# Patient Record
Sex: Male | Born: 1940 | Race: White | Hispanic: No | Marital: Single
Health system: Southern US, Community
[De-identification: ages and names within clinical notes are randomized; demographics above are authoritative.]

---

## 2014-03-23 ENCOUNTER — Emergency Department: Payer: Self-pay | Admitting: Emergency Medicine

## 2014-03-23 LAB — CBC
HCT: 44.2 % (ref 40.0–52.0)
HGB: 14.4 g/dL (ref 13.0–18.0)
MCH: 28.5 pg (ref 26.0–34.0)
MCHC: 32.5 g/dL (ref 32.0–36.0)
MCV: 88 fL (ref 80–100)
PLATELETS: 447 10*3/uL — AB (ref 150–440)
RBC: 5.05 10*6/uL (ref 4.40–5.90)
RDW: 13.6 % (ref 11.5–14.5)
WBC: 15 10*3/uL — AB (ref 3.8–10.6)

## 2014-03-23 LAB — COMPREHENSIVE METABOLIC PANEL
ALBUMIN: 2.7 g/dL — AB (ref 3.4–5.0)
ALT: 57 U/L (ref 12–78)
Alkaline Phosphatase: 188 U/L — ABNORMAL HIGH
Anion Gap: 10 (ref 7–16)
BILIRUBIN TOTAL: 0.9 mg/dL (ref 0.2–1.0)
BUN: 10 mg/dL (ref 7–18)
CREATININE: 0.72 mg/dL (ref 0.60–1.30)
Calcium, Total: 9.1 mg/dL (ref 8.5–10.1)
Chloride: 94 mmol/L — ABNORMAL LOW (ref 98–107)
Co2: 25 mmol/L (ref 21–32)
EGFR (African American): >60
EGFR (Non-African Amer.): >60
GLUCOSE: 106 mg/dL — AB (ref 65–99)
Osmolality: 258 (ref 275–301)
Potassium: 4.4 mmol/L (ref 3.5–5.1)
SGOT(AST): 70 U/L — ABNORMAL HIGH (ref 15–37)
SODIUM: 129 mmol/L — AB (ref 136–145)
Total Protein: 7.7 g/dL (ref 6.4–8.2)

## 2014-03-23 LAB — CK TOTAL AND CKMB (NOT AT ARMC)
CK, Total: 33 U/L — ABNORMAL LOW
CK-MB: <0.5 ng/mL — ABNORMAL LOW (ref 0.5–3.6)

## 2014-03-23 LAB — TROPONIN I

## 2014-03-26 LAB — PATHOLOGY REPORT

## 2014-03-31 ENCOUNTER — Ambulatory Visit: Payer: Self-pay | Admitting: Internal Medicine

## 2014-03-31 ENCOUNTER — Ambulatory Visit: Payer: Self-pay

## 2014-03-31 LAB — CBC CANCER CENTER
Basophil #: 0.1 x10 3/mm (ref 0.0–0.1)
Basophil %: 0.8 %
EOS PCT: 0.4 %
Eosinophil #: 0.1 x10 3/mm (ref 0.0–0.7)
HCT: 43.9 % (ref 40.0–52.0)
HGB: 14.2 g/dL (ref 13.0–18.0)
LYMPHS ABS: 2.2 x10 3/mm (ref 1.0–3.6)
Lymphocyte %: 12 %
MCH: 28.2 pg (ref 26.0–34.0)
MCHC: 32.5 g/dL (ref 32.0–36.0)
MCV: 87 fL (ref 80–100)
MONOS PCT: 10.5 %
Monocyte #: 2 x10 3/mm — ABNORMAL HIGH (ref 0.2–1.0)
NEUTROS ABS: 14.4 x10 3/mm — AB (ref 1.4–6.5)
NEUTROS PCT: 76.3 %
Platelet: 520 x10 3/mm — ABNORMAL HIGH (ref 150–440)
RBC: 5.04 10*6/uL (ref 4.40–5.90)
RDW: 14.2 % (ref 11.5–14.5)
WBC: 18.8 x10 3/mm — ABNORMAL HIGH (ref 3.8–10.6)

## 2014-03-31 LAB — COMPREHENSIVE METABOLIC PANEL
ALK PHOS: 175 U/L — AB
Albumin: 2.9 g/dL — ABNORMAL LOW (ref 3.4–5.0)
Anion Gap: 9 (ref 7–16)
BUN: 7 mg/dL (ref 7–18)
Bilirubin,Total: 0.8 mg/dL (ref 0.2–1.0)
CALCIUM: 9.4 mg/dL (ref 8.5–10.1)
CO2: 31 mmol/L (ref 21–32)
Chloride: 94 mmol/L — ABNORMAL LOW (ref 98–107)
Creatinine: 0.74 mg/dL (ref 0.60–1.30)
EGFR (African American): >60
Glucose: 94 mg/dL (ref 65–99)
OSMOLALITY: 266 (ref 275–301)
POTASSIUM: 4.1 mmol/L (ref 3.5–5.1)
SGOT(AST): 53 U/L — ABNORMAL HIGH (ref 15–37)
SGPT (ALT): 38 U/L (ref 12–78)
SODIUM: 134 mmol/L — AB (ref 136–145)
Total Protein: 7.6 g/dL (ref 6.4–8.2)

## 2014-03-31 LAB — APTT: Activated PTT: 32.1 secs (ref 23.6–35.9)

## 2014-03-31 LAB — PROTIME-INR
INR: 1.1
Prothrombin Time: 14.5 secs (ref 11.5–14.7)

## 2014-04-01 LAB — CANCER ANTIGEN 19-9: CA 19-9: 10956 U/mL — ABNORMAL HIGH (ref 0–35)

## 2014-04-07 ENCOUNTER — Ambulatory Visit: Payer: Self-pay

## 2014-04-07 ENCOUNTER — Ambulatory Visit: Payer: Self-pay | Admitting: Internal Medicine

## 2014-04-07 DIAGNOSIS — C259 Malignant neoplasm of pancreas, unspecified: Secondary | ICD-10-CM

## 2014-04-07 LAB — BASIC METABOLIC PANEL
Anion Gap: 6 — ABNORMAL LOW (ref 7–16)
BUN: 8 mg/dL (ref 7–18)
CHLORIDE: 92 mmol/L — AB (ref 98–107)
Calcium, Total: 8.9 mg/dL (ref 8.5–10.1)
Co2: 33 mmol/L — ABNORMAL HIGH (ref 21–32)
Creatinine: 0.64 mg/dL (ref 0.60–1.30)
EGFR (African American): >60
GLUCOSE: 129 mg/dL — AB (ref 65–99)
OSMOLALITY: 263 (ref 275–301)
Potassium: 4.3 mmol/L (ref 3.5–5.1)
Sodium: 131 mmol/L — ABNORMAL LOW (ref 136–145)

## 2014-04-08 LAB — PATHOLOGY REPORT

## 2014-04-10 ENCOUNTER — Ambulatory Visit: Payer: Self-pay

## 2014-04-14 LAB — CBC CANCER CENTER
HCT: 41.4 % (ref 40.0–52.0)
HGB: 12.9 g/dL — ABNORMAL LOW (ref 13.0–18.0)
LYMPHS PCT: 7 %
MCH: 27.7 pg (ref 26.0–34.0)
MCHC: 31.2 g/dL — AB (ref 32.0–36.0)
MCV: 89 fL (ref 80–100)
Metamyelocyte: 1 %
Monocytes: 14 %
PLATELETS: 533 x10 3/mm — AB (ref 150–440)
RBC: 4.66 10*6/uL (ref 4.40–5.90)
RDW: 15.5 % — ABNORMAL HIGH (ref 11.5–14.5)
Segmented Neutrophils: 78 %
WBC: 19.8 x10 3/mm — AB (ref 3.8–10.6)

## 2014-04-14 LAB — HEPATIC FUNCTION PANEL A (ARMC)
ALBUMIN: 2.3 g/dL — AB (ref 3.4–5.0)
ALK PHOS: 246 U/L — AB
Bilirubin, Direct: 0.2 mg/dL (ref 0.00–0.20)
Bilirubin,Total: 0.9 mg/dL (ref 0.2–1.0)
SGOT(AST): 53 U/L — ABNORMAL HIGH (ref 15–37)
SGPT (ALT): 18 U/L
TOTAL PROTEIN: 6.9 g/dL (ref 6.4–8.2)

## 2014-04-14 LAB — CREATININE, SERUM: Creatinine: 0.48 mg/dL — ABNORMAL LOW (ref 0.60–1.30)

## 2014-04-18 ENCOUNTER — Ambulatory Visit: Payer: Self-pay | Admitting: Internal Medicine

## 2014-04-23 LAB — BASIC METABOLIC PANEL
ANION GAP: 10 (ref 7–16)
BUN: 18 mg/dL (ref 7–18)
CREATININE: 0.92 mg/dL (ref 0.60–1.30)
Calcium, Total: 9.2 mg/dL (ref 8.5–10.1)
Chloride: 96 mmol/L — ABNORMAL LOW (ref 98–107)
Co2: 25 mmol/L (ref 21–32)
EGFR (African American): >60
EGFR (Non-African Amer.): >60
Glucose: 165 mg/dL — ABNORMAL HIGH (ref 65–99)
Osmolality: 268 (ref 275–301)
POTASSIUM: 4.4 mmol/L (ref 3.5–5.1)
SODIUM: 131 mmol/L — AB (ref 136–145)

## 2014-04-23 LAB — CBC CANCER CENTER
BASOS ABS: 0.1 x10 3/mm (ref 0.0–0.1)
BASOS PCT: 0.5 %
Eosinophil #: 0 x10 3/mm (ref 0.0–0.7)
Eosinophil %: 0.4 %
HCT: 39.7 % — ABNORMAL LOW (ref 40.0–52.0)
HGB: 12.7 g/dL — ABNORMAL LOW (ref 13.0–18.0)
LYMPHS ABS: 1.8 x10 3/mm (ref 1.0–3.6)
LYMPHS PCT: 14.4 %
MCH: 28.6 pg (ref 26.0–34.0)
MCHC: 32.1 g/dL (ref 32.0–36.0)
MCV: 89 fL (ref 80–100)
Monocyte #: 1.3 x10 3/mm — ABNORMAL HIGH (ref 0.2–1.0)
Monocyte %: 10.6 %
Neutrophil #: 9.1 x10 3/mm — ABNORMAL HIGH (ref 1.4–6.5)
Neutrophil %: 74.1 %
Platelet: 307 x10 3/mm (ref 150–440)
RBC: 4.46 10*6/uL (ref 4.40–5.90)
RDW: 15.2 % — AB (ref 11.5–14.5)
WBC: 12.3 x10 3/mm — ABNORMAL HIGH (ref 3.8–10.6)

## 2014-05-07 LAB — CBC CANCER CENTER
BASOS PCT: 0.7 %
Basophil #: 0.1 x10 3/mm (ref 0.0–0.1)
Eosinophil #: 0 x10 3/mm (ref 0.0–0.7)
Eosinophil %: 0.2 %
HCT: 36.4 % — ABNORMAL LOW (ref 40.0–52.0)
HGB: 11.6 g/dL — AB (ref 13.0–18.0)
LYMPHS PCT: 8.5 %
Lymphocyte #: 1.6 x10 3/mm (ref 1.0–3.6)
MCH: 27.8 pg (ref 26.0–34.0)
MCHC: 32 g/dL (ref 32.0–36.0)
MCV: 87 fL (ref 80–100)
MONO ABS: 2.2 x10 3/mm — AB (ref 0.2–1.0)
Monocyte %: 11.8 %
NEUTROS ABS: 14.4 x10 3/mm — AB (ref 1.4–6.5)
NEUTROS PCT: 78.8 %
Platelet: 551 x10 3/mm — ABNORMAL HIGH (ref 150–440)
RBC: 4.18 10*6/uL — ABNORMAL LOW (ref 4.40–5.90)
RDW: 16.3 % — ABNORMAL HIGH (ref 11.5–14.5)
WBC: 18.3 x10 3/mm — ABNORMAL HIGH (ref 3.8–10.6)

## 2014-05-07 LAB — BASIC METABOLIC PANEL
Anion Gap: 8 (ref 7–16)
BUN: 14 mg/dL (ref 7–18)
CALCIUM: 9.1 mg/dL (ref 8.5–10.1)
CHLORIDE: 94 mmol/L — AB (ref 98–107)
CO2: 29 mmol/L (ref 21–32)
CREATININE: 0.7 mg/dL (ref 0.60–1.30)
EGFR (Non-African Amer.): >60
Glucose: 104 mg/dL — ABNORMAL HIGH (ref 65–99)
Osmolality: 263 (ref 275–301)
POTASSIUM: 3.8 mmol/L (ref 3.5–5.1)
Sodium: 131 mmol/L — ABNORMAL LOW (ref 136–145)

## 2014-05-07 LAB — HEPATIC FUNCTION PANEL A (ARMC)
ALK PHOS: 205 U/L — AB
ALT: 52 U/L
AST: 41 U/L — AB (ref 15–37)
Albumin: 2.5 g/dL — ABNORMAL LOW (ref 3.4–5.0)
BILIRUBIN DIRECT: 0.4 mg/dL — AB (ref 0.00–0.20)
BILIRUBIN TOTAL: 1.4 mg/dL — AB (ref 0.2–1.0)
Total Protein: 6.9 g/dL (ref 6.4–8.2)

## 2014-05-07 LAB — PHOSPHORUS: PHOSPHORUS: 3.6 mg/dL (ref 2.5–4.9)

## 2014-05-07 LAB — MAGNESIUM: MAGNESIUM: 1.7 mg/dL — AB

## 2014-05-14 LAB — CREATININE, SERUM: Creatinine: 0.69 mg/dL (ref 0.60–1.30)

## 2014-05-14 LAB — CBC CANCER CENTER
BASOS ABS: 0.1 x10 3/mm (ref 0.0–0.1)
Basophil %: 0.7 %
Eosinophil #: 0.1 x10 3/mm (ref 0.0–0.7)
Eosinophil %: 0.6 %
HCT: 35.7 % — AB (ref 40.0–52.0)
HGB: 11.8 g/dL — ABNORMAL LOW (ref 13.0–18.0)
LYMPHS PCT: 16.7 %
Lymphocyte #: 1.7 x10 3/mm (ref 1.0–3.6)
MCH: 29 pg (ref 26.0–34.0)
MCHC: 33 g/dL (ref 32.0–36.0)
MCV: 88 fL (ref 80–100)
MONOS PCT: 12.3 %
Monocyte #: 1.2 x10 3/mm — ABNORMAL HIGH (ref 0.2–1.0)
NEUTROS PCT: 69.7 %
Neutrophil #: 7 x10 3/mm — ABNORMAL HIGH (ref 1.4–6.5)
Platelet: 375 x10 3/mm (ref 150–440)
RBC: 4.08 10*6/uL — ABNORMAL LOW (ref 4.40–5.90)
RDW: 15.9 % — ABNORMAL HIGH (ref 11.5–14.5)
WBC: 10 x10 3/mm (ref 3.8–10.6)

## 2014-05-19 ENCOUNTER — Ambulatory Visit: Payer: Self-pay | Admitting: Internal Medicine

## 2014-05-21 LAB — BASIC METABOLIC PANEL
Anion Gap: 7 (ref 7–16)
BUN: 10 mg/dL (ref 7–18)
CALCIUM: 8.7 mg/dL (ref 8.5–10.1)
CHLORIDE: 99 mmol/L (ref 98–107)
CO2: 30 mmol/L (ref 21–32)
Creatinine: 0.59 mg/dL — ABNORMAL LOW (ref 0.60–1.30)
EGFR (African American): >60
Glucose: 90 mg/dL (ref 65–99)
Osmolality: 271 (ref 275–301)
POTASSIUM: 3.2 mmol/L — AB (ref 3.5–5.1)
Sodium: 136 mmol/L (ref 136–145)

## 2014-05-21 LAB — CBC CANCER CENTER
BASOS ABS: 0.1 x10 3/mm (ref 0.0–0.1)
BASOS PCT: 0.9 %
EOS ABS: 0 x10 3/mm (ref 0.0–0.7)
EOS PCT: 0.8 %
HCT: 31.3 % — ABNORMAL LOW (ref 40.0–52.0)
HGB: 10.4 g/dL — ABNORMAL LOW (ref 13.0–18.0)
LYMPHS PCT: 29.3 %
Lymphocyte #: 1.8 x10 3/mm (ref 1.0–3.6)
MCH: 29.2 pg (ref 26.0–34.0)
MCHC: 33.4 g/dL (ref 32.0–36.0)
MCV: 87 fL (ref 80–100)
MONO ABS: 0.6 x10 3/mm (ref 0.2–1.0)
MONOS PCT: 9.6 %
Neutrophil #: 3.7 x10 3/mm (ref 1.4–6.5)
Neutrophil %: 59.4 %
Platelet: 242 x10 3/mm (ref 150–440)
RBC: 3.58 10*6/uL — AB (ref 4.40–5.90)
RDW: 16.3 % — ABNORMAL HIGH (ref 11.5–14.5)
WBC: 6.2 x10 3/mm (ref 3.8–10.6)

## 2014-05-21 LAB — HEPATIC FUNCTION PANEL A (ARMC)
ALBUMIN: 2.4 g/dL — AB (ref 3.4–5.0)
ALT: 103 U/L — AB
AST: 64 U/L — AB (ref 15–37)
Alkaline Phosphatase: 261 U/L — ABNORMAL HIGH
BILIRUBIN DIRECT: 0.5 mg/dL — AB (ref 0.00–0.20)
Bilirubin,Total: 1.4 mg/dL — ABNORMAL HIGH (ref 0.2–1.0)
Total Protein: 6.1 g/dL — ABNORMAL LOW (ref 6.4–8.2)

## 2014-05-21 LAB — PHOSPHORUS: Phosphorus: 3.4 mg/dL (ref 2.5–4.9)

## 2014-05-21 LAB — MAGNESIUM: Magnesium: 1.7 mg/dL — ABNORMAL LOW

## 2014-05-28 LAB — COMPREHENSIVE METABOLIC PANEL
Albumin: 2.4 g/dL — ABNORMAL LOW (ref 3.4–5.0)
Alkaline Phosphatase: 267 U/L — ABNORMAL HIGH
Anion Gap: 8 (ref 7–16)
BILIRUBIN TOTAL: 1.2 mg/dL — AB (ref 0.2–1.0)
BUN: 11 mg/dL (ref 7–18)
CALCIUM: 8.5 mg/dL (ref 8.5–10.1)
CHLORIDE: 99 mmol/L (ref 98–107)
Co2: 27 mmol/L (ref 21–32)
Creatinine: 0.54 mg/dL — ABNORMAL LOW (ref 0.60–1.30)
EGFR (African American): >60
EGFR (Non-African Amer.): >60
Glucose: 89 mg/dL (ref 65–99)
Osmolality: 267 (ref 275–301)
Potassium: 3.6 mmol/L (ref 3.5–5.1)
SGOT(AST): 76 U/L — ABNORMAL HIGH (ref 15–37)
SGPT (ALT): 78 U/L — ABNORMAL HIGH
SODIUM: 134 mmol/L — AB (ref 136–145)
TOTAL PROTEIN: 6.2 g/dL — AB (ref 6.4–8.2)

## 2014-05-28 LAB — MAGNESIUM: MAGNESIUM: 1.6 mg/dL — AB

## 2014-05-28 LAB — CBC CANCER CENTER
BASOS ABS: 0.1 x10 3/mm (ref 0.0–0.1)
BASOS PCT: 1.4 %
EOS ABS: 0.2 x10 3/mm (ref 0.0–0.7)
EOS PCT: 1.9 %
HCT: 33.1 % — AB (ref 40.0–52.0)
HGB: 10.6 g/dL — AB (ref 13.0–18.0)
LYMPHS ABS: 2.3 x10 3/mm (ref 1.0–3.6)
LYMPHS PCT: 21.9 %
MCH: 28.6 pg (ref 26.0–34.0)
MCHC: 32 g/dL (ref 32.0–36.0)
MCV: 89 fL (ref 80–100)
Monocyte #: 1.4 x10 3/mm — ABNORMAL HIGH (ref 0.2–1.0)
Monocyte %: 13.4 %
Neutrophil #: 6.5 x10 3/mm (ref 1.4–6.5)
Neutrophil %: 61.4 %
Platelet: 698 x10 3/mm — ABNORMAL HIGH (ref 150–440)
RBC: 3.7 10*6/uL — ABNORMAL LOW (ref 4.40–5.90)
RDW: 19 % — AB (ref 11.5–14.5)
WBC: 10.5 x10 3/mm (ref 3.8–10.6)

## 2014-05-28 LAB — PHOSPHORUS: Phosphorus: 3.4 mg/dL (ref 2.5–4.9)

## 2014-06-04 LAB — CBC CANCER CENTER
BASOS ABS: 0.1 x10 3/mm (ref 0.0–0.1)
BASOS PCT: 0.7 %
Eosinophil #: 0.1 x10 3/mm (ref 0.0–0.7)
Eosinophil %: 0.6 %
HCT: 29.6 % — ABNORMAL LOW (ref 40.0–52.0)
HGB: 9.7 g/dL — ABNORMAL LOW (ref 13.0–18.0)
LYMPHS ABS: 2.7 x10 3/mm (ref 1.0–3.6)
Lymphocyte %: 28 %
MCH: 29.7 pg (ref 26.0–34.0)
MCHC: 32.7 g/dL (ref 32.0–36.0)
MCV: 91 fL (ref 80–100)
Monocyte #: 1.4 x10 3/mm — ABNORMAL HIGH (ref 0.2–1.0)
Monocyte %: 14.7 %
Neutrophil #: 5.4 x10 3/mm (ref 1.4–6.5)
Neutrophil %: 56 %
PLATELETS: 549 x10 3/mm — AB (ref 150–440)
RBC: 3.26 10*6/uL — AB (ref 4.40–5.90)
RDW: 19.3 % — AB (ref 11.5–14.5)
WBC: 9.6 x10 3/mm (ref 3.8–10.6)

## 2014-06-04 LAB — CREATININE, SERUM
Creatinine: 0.66 mg/dL (ref 0.60–1.30)
EGFR (African American): >60
EGFR (Non-African Amer.): >60

## 2014-06-08 ENCOUNTER — Ambulatory Visit: Payer: Self-pay | Admitting: Internal Medicine

## 2014-06-08 LAB — CANCER ANTIGEN 19-9: CA 19-9: 16941 U/mL — ABNORMAL HIGH (ref 0–35)

## 2014-06-15 LAB — CBC CANCER CENTER
Basophil #: 0 x10 3/mm (ref 0.0–0.1)
Basophil %: 0.3 %
Eosinophil #: 0.1 x10 3/mm (ref 0.0–0.7)
Eosinophil %: 0.5 %
HCT: 30.8 % — ABNORMAL LOW (ref 40.0–52.0)
HGB: 10.1 g/dL — ABNORMAL LOW (ref 13.0–18.0)
LYMPHS ABS: 1.9 x10 3/mm (ref 1.0–3.6)
LYMPHS PCT: 10.8 %
MCH: 31.6 pg (ref 26.0–34.0)
MCHC: 32.7 g/dL (ref 32.0–36.0)
MCV: 97 fL (ref 80–100)
Monocyte #: 1.5 x10 3/mm — ABNORMAL HIGH (ref 0.2–1.0)
Monocyte %: 8.1 %
NEUTROS ABS: 14.4 x10 3/mm — AB (ref 1.4–6.5)
Neutrophil %: 80.3 %
PLATELETS: 554 x10 3/mm — AB (ref 150–440)
RBC: 3.18 10*6/uL — ABNORMAL LOW (ref 4.40–5.90)
RDW: 22.9 % — AB (ref 11.5–14.5)
WBC: 17.9 x10 3/mm — AB (ref 3.8–10.6)

## 2014-06-15 LAB — COMPREHENSIVE METABOLIC PANEL
ALBUMIN: 2.3 g/dL — AB (ref 3.4–5.0)
ALT: 64 U/L — AB
Alkaline Phosphatase: 433 U/L — ABNORMAL HIGH
Anion Gap: 11 (ref 7–16)
BUN: 14 mg/dL (ref 7–18)
Bilirubin,Total: 1.4 mg/dL — ABNORMAL HIGH (ref 0.2–1.0)
CREATININE: 0.88 mg/dL (ref 0.60–1.30)
Calcium, Total: 8.9 mg/dL (ref 8.5–10.1)
Chloride: 101 mmol/L (ref 98–107)
Co2: 21 mmol/L (ref 21–32)
EGFR (African American): >60
Glucose: 162 mg/dL — ABNORMAL HIGH (ref 65–99)
Osmolality: 270 (ref 275–301)
POTASSIUM: 3.3 mmol/L — AB (ref 3.5–5.1)
SGOT(AST): 74 U/L — ABNORMAL HIGH (ref 15–37)
Sodium: 133 mmol/L — ABNORMAL LOW (ref 136–145)
Total Protein: 6.3 g/dL — ABNORMAL LOW (ref 6.4–8.2)

## 2014-06-18 ENCOUNTER — Ambulatory Visit: Payer: Self-pay | Admitting: Internal Medicine

## 2014-07-19 ENCOUNTER — Ambulatory Visit: Payer: Self-pay | Admitting: Internal Medicine

## 2014-07-19 DEATH — deceased

## 2015-01-09 NOTE — Op Note (Signed)
PATIENT NAME:  Henry Parks, Henry Parks MR#:  [id number] DATE OF BIRTH:  1941/02/24  DATE OF PROCEDURE:  04/10/2014  SURGEON: Dr. Genevive Bi.   ASSISTANT: None.   PREOPERATIVE DIAGNOSIS: Metastatic cancer.   POSTOPERATIVE DIAGNOSIS: Metastatic cancer.   OPERATION PERFORMED: Insertion of right internal jugular ultrasound-guided Port-A-Cath.   INDICATIONS FOR PROCEDURE: Mr. 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 He was identified as a possible candidate for chemotherapy and the indications and risks were explained to the patient who gave his informed consent.   DESCRIPTION OF PROCEDURE: The patient was brought to the operating suite and placed in the supine position. Laryngeal mask airway was given. The patient was prepped and draped in the usual sterile fashion. The right internal jugular vein was percutaneously catheterized using ultrasound guidance. The catheter was introduced into the right side of the heart. A port site was selected on the anterior chest wall and was created using a combination of blunt and sharp dissection. The catheter was then tunneled from our port site up to the neck incision and was inserted through a peel-away sheath into the right side of the heart. It was then positioned at the junction of the right atrium and superior vena cava. The catheter was then trimmed to appropriate length and it was then secured to the port. The port was then flushed and it was then secured to the anterior chest wall with multiple interrupted Prolene sutures. The wounds were irrigated and the subcutaneous tissues were closed with Vicryl sutures. The skin was closed with nylon. Sterile dressings were applied. The patient was then taken to the recovery room in stable condition.    ____________________________ Lew Dawes Genevive Bi, MD teo:lt D: 04/10/2014 16:49:25 ET T: 04/11/2014 01:14:41 ET JOB#: [id number]  cc: Christia Reading E. Genevive Bi, MD, <Dictator> Louis Matte MD ELECTRONICALLY SIGNED 04/20/2014 11:55

## 2015-04-12 IMAGING — CT CT ABD-PELV W/ CM
2 of 10 series · 13 of 46 positions shown, 15 images · IV contrast (isovue)
Comparison: 03/31/2014.

CLINICAL DATA: Pancreatic cancer.

EXAM:
CT ABDOMEN AND PELVIS WITH CONTRAST
TECHNIQUE: Multidetector CT imaging of the abdomen and pelvis was performed
using the standard protocol following bolus administration of
intravenous contrast.
CONTRAST:  125 cc Isovue 370.

[Series 7: pancreas venous thins · axial · portal-venous · 0.68mm/px · z∈[-760,-411]mm · 10 of 357 slices shown, 12 images]
[im 33/357  soft-tissue]
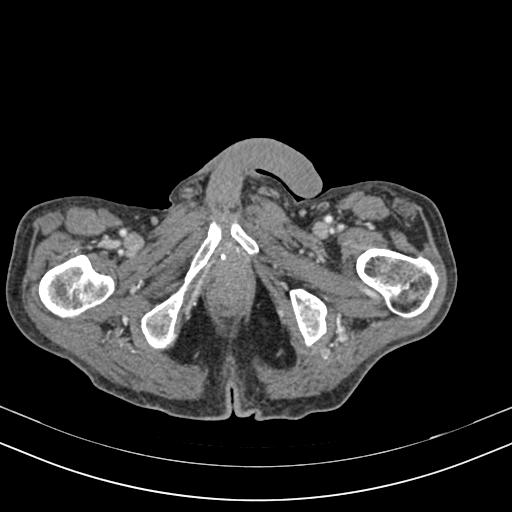
[im 33/357  bone]
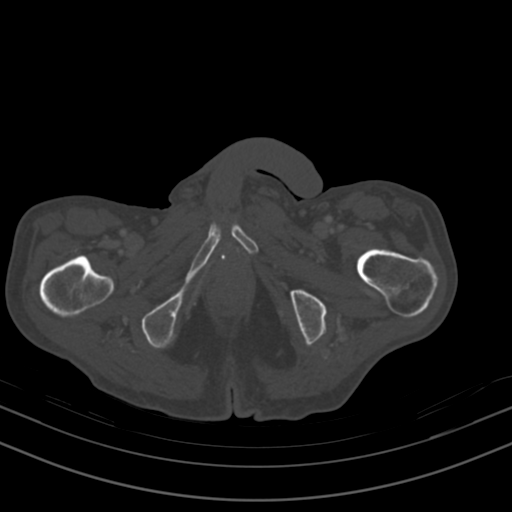
[im 65/357  soft-tissue]
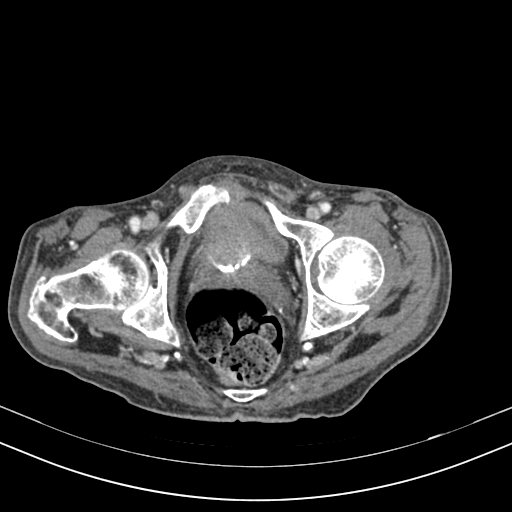
[im 98/357  soft-tissue]
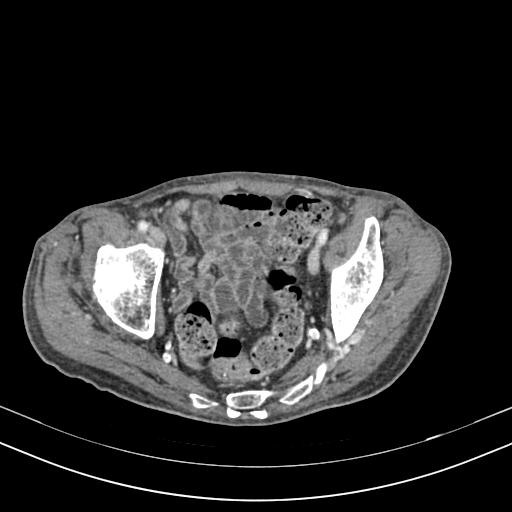
[im 130/357  soft-tissue]
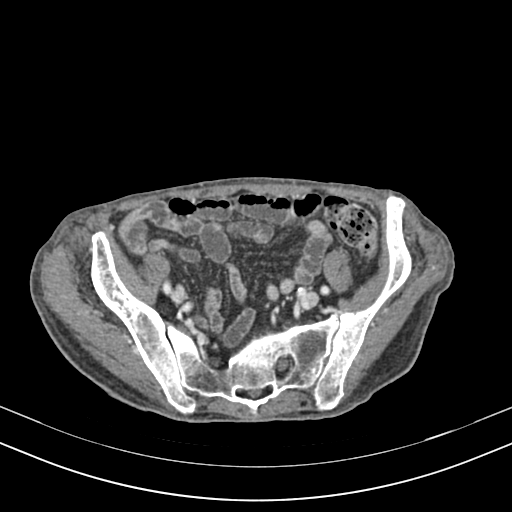
[im 162/357  soft-tissue]
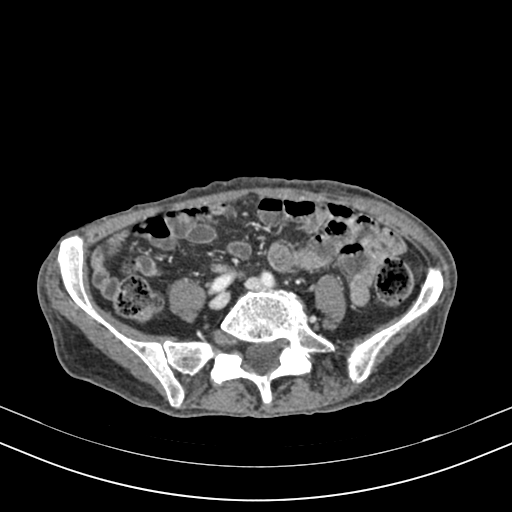
[im 195/357  soft-tissue]
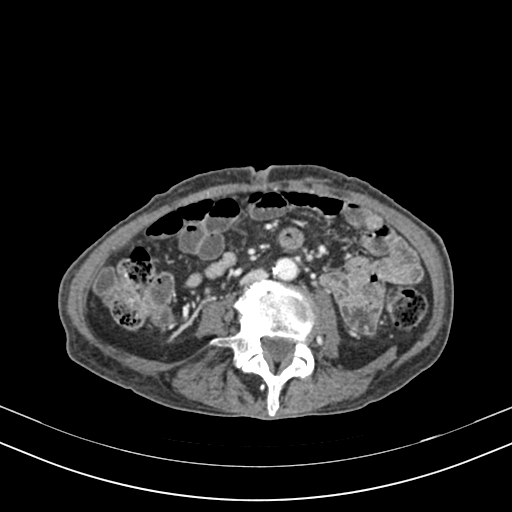
[im 227/357  soft-tissue]
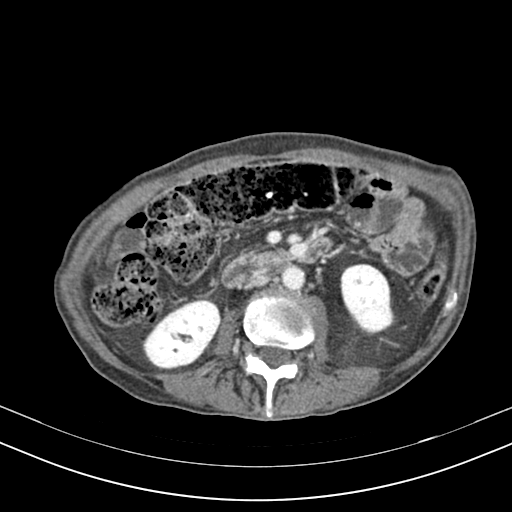
[im 259/357  soft-tissue]
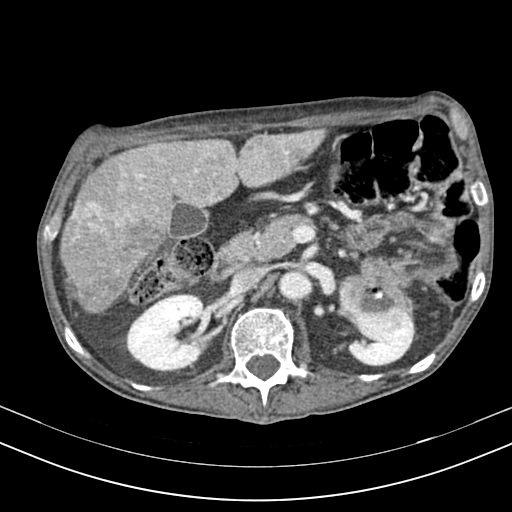
[im 292/357  soft-tissue]
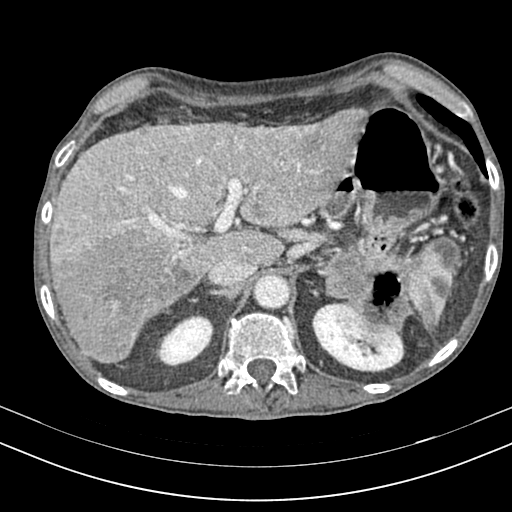
[im 292/357  bone]
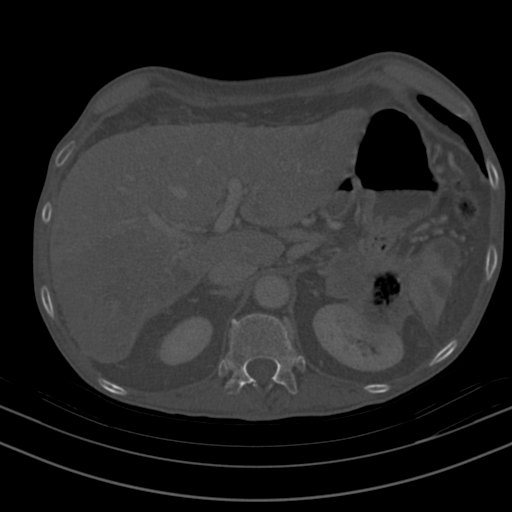
[im 324/357  soft-tissue]
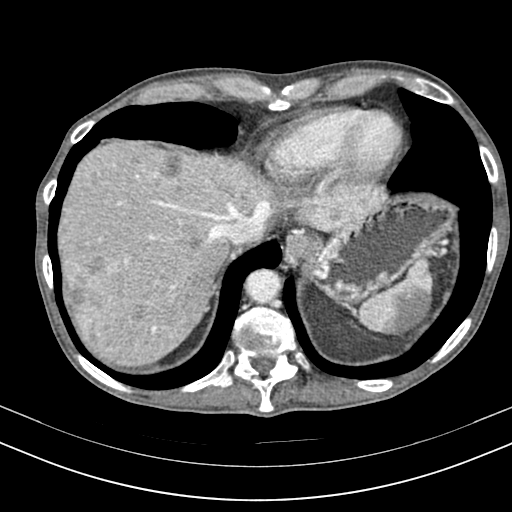

[Series 602: arterial coronal · coronal · arterial · 0.84mm/px · 3 of 98 slices shown]
[im 25/98  soft-tissue]
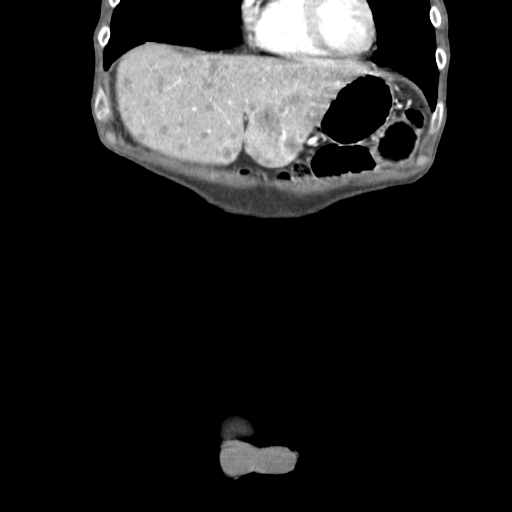
[im 49/98  soft-tissue]
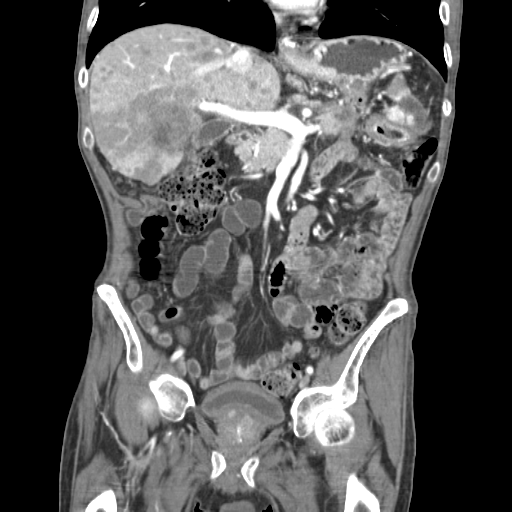
[im 73/98  soft-tissue]
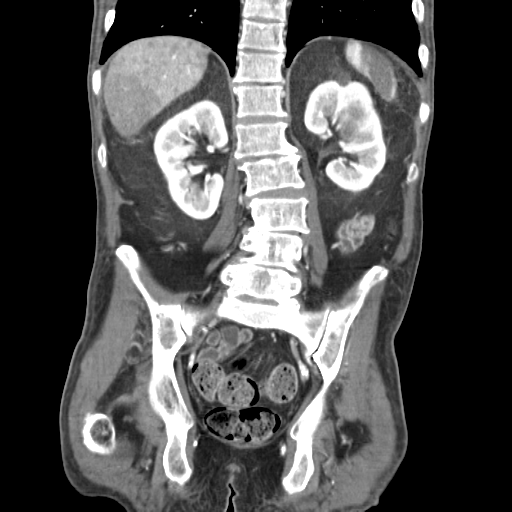

[13 of 46 positions shown; findings below may reference images not displayed]

FINDINGS: Lower chest: No evidnce for pulmonary nodule or mass. No focal
consolidation. No evidence for pleural effusion.

Hepatobiliary: Widespread metastases are noted within the liver, as
before. The number of metastases within the liver parenchyma has
increased in the interval. Index lesion in the posterior right liver
(image 25 series 6 today) measures 2.5 x 1.8 cm compared to 2.0 x
1.1 cm previously. Index lesion in the lateral segment of the left
liver (image 43 today) measures 2.5 x 1.5 cm compared to 2.2 x
cm. Small lesion in the posterior aspect the lateral segment left
liver measures 1.2 cm today which is unchanged in the interval.

Spleen: Irregular enhancement is seen in the medial aspect of the
inferior spleen, and region contiguous with the irregular pancreatic
tail mass. This is consistent with tumor involvement of the spleen.

Pancreas: Previously necrotic pancreatic tail mass measures 6.5 x
6.5 cm which is slightly smaller on the previous study. Much to the
internal necrosis seen previously has decreased in the interval
although now the mass is filled with gas and debris as well as some
calcifications or clumped areas of oral contrast. Anteriorly, the
mass invades the posterior wall of the mid stomach. There is also
lateral extension to involves the splenic flexure the colon. On
image 54 of series 3, a gas filled fistula can be identified between
the colonic lumen and the pancreatic mass.

Stomach/Bowel: As noted above, the pancreatic mass invades the
posterior stomach. Duodenum is unremarkable. No evidence for small
bowel obstruction. Pancreatic mass invades the splenic flexure of
the colon generating a gas and stool-filled fistula into the
necrotic center of the pancreatic tail mass.

Adrenals/urinary tract: No a right adrenal nodule or mass. Left
adrenal gland is obscured by the pancreatic tail mass. Right kidney
is unremarkable. Pancreatic tail neoplasm invades the anterior left
kidney.

Vascular/Lymphatic: No abdominal aortic aneurysm. The portal vein is
patent. The splenic vein is obliterated by the pancreatic tail mass.
Small lymph nodes in the hepatoduodenal ligament are stable. No
gastrohepatic lymphadenopathy. No retroperitoneal lymphadenopathy in
the abdomen.

Reproductive: Prostate gland is markedly enlarged and irregular
circumferential bladder wall thickening may be related to bladder
outlet obstruction.

Musculoskeletal: Bone windows reveal no worrisome lytic or sclerotic
osseous lesions.

Other: No intraperitoneal free fluid.
IMPRESSION: The mass in the tail the pancreas has invaded the splenic flexure of
the colon with development of a fistulous tract between the colonic
lumen and the necrotic center of the pancreatic tail mass. Although
the mass measures smaller now than it did on the previous study,
this is likely due to decompression of some of the central necrotic
contents into the colonic lumen, but the mass lesion is now filled
with debris and gas suggesting flow of colonic contents into the
necrotic central cavity of the pancreatic tail mass. The mass also
invades the posterior wall of the stomach, the medial spleen, and
the anterior left kidney.

Interval increase in the number of hepatic metastases which are
innumerable. Some of the index metastases show interval increase in
size.

These results will be called to the ordering clinician or
representative by the Radiologist Assistant, and communication
documented in the PACS or zVision Dashboard.
